# Patient Record
Sex: Female | Born: 1935 | Race: Black or African American | Hispanic: No | Smoking: Former smoker
Health system: Southern US, Community
[De-identification: ages and names within clinical notes are randomized; demographics above are authoritative.]

## PROBLEM LIST (undated history)

## (undated) DIAGNOSIS — I739 Peripheral vascular disease, unspecified: Secondary | ICD-10-CM

---

## 2010-05-30 ENCOUNTER — Emergency Department (HOSPITAL_BASED_OUTPATIENT_CLINIC_OR_DEPARTMENT_OTHER)
Admission: EM | Admit: 2010-05-30 | Discharge: 2010-05-30 | Payer: Self-pay | Source: Home / Self Care | Admitting: Emergency Medicine

## 2010-05-30 ENCOUNTER — Ambulatory Visit: Payer: Self-pay | Admitting: Diagnostic Radiology

## 2011-01-27 ENCOUNTER — Emergency Department (HOSPITAL_BASED_OUTPATIENT_CLINIC_OR_DEPARTMENT_OTHER)
Admission: EM | Admit: 2011-01-27 | Discharge: 2011-01-27 | Disposition: A | Discharge status: Home / Self Care | Payer: PRIVATE HEALTH INSURANCE | Attending: Emergency Medicine | Admitting: Emergency Medicine

## 2011-01-27 DIAGNOSIS — K219 Gastro-esophageal reflux disease without esophagitis: Secondary | ICD-10-CM | POA: Insufficient documentation

## 2011-01-27 DIAGNOSIS — E7800 Pure hypercholesterolemia, unspecified: Secondary | ICD-10-CM | POA: Insufficient documentation

## 2011-01-27 DIAGNOSIS — J4489 Other specified chronic obstructive pulmonary disease: Secondary | ICD-10-CM | POA: Insufficient documentation

## 2011-01-27 DIAGNOSIS — J449 Chronic obstructive pulmonary disease, unspecified: Secondary | ICD-10-CM | POA: Insufficient documentation

## 2011-01-27 DIAGNOSIS — Z79899 Other long term (current) drug therapy: Secondary | ICD-10-CM | POA: Insufficient documentation

## 2011-01-27 DIAGNOSIS — E039 Hypothyroidism, unspecified: Secondary | ICD-10-CM | POA: Insufficient documentation

## 2011-01-27 DIAGNOSIS — S90569A Insect bite (nonvenomous), unspecified ankle, initial encounter: Secondary | ICD-10-CM | POA: Insufficient documentation

## 2011-10-03 IMAGING — CR DG HAND COMPLETE 3+V*L*
3 series · 3 of 3 positions shown · non-contrast
Comparison: None.

CLINICAL DATA: Left hand pain after a fall.

LEFT HAND - COMPLETE 3+ VIEW

[x hand pa left]
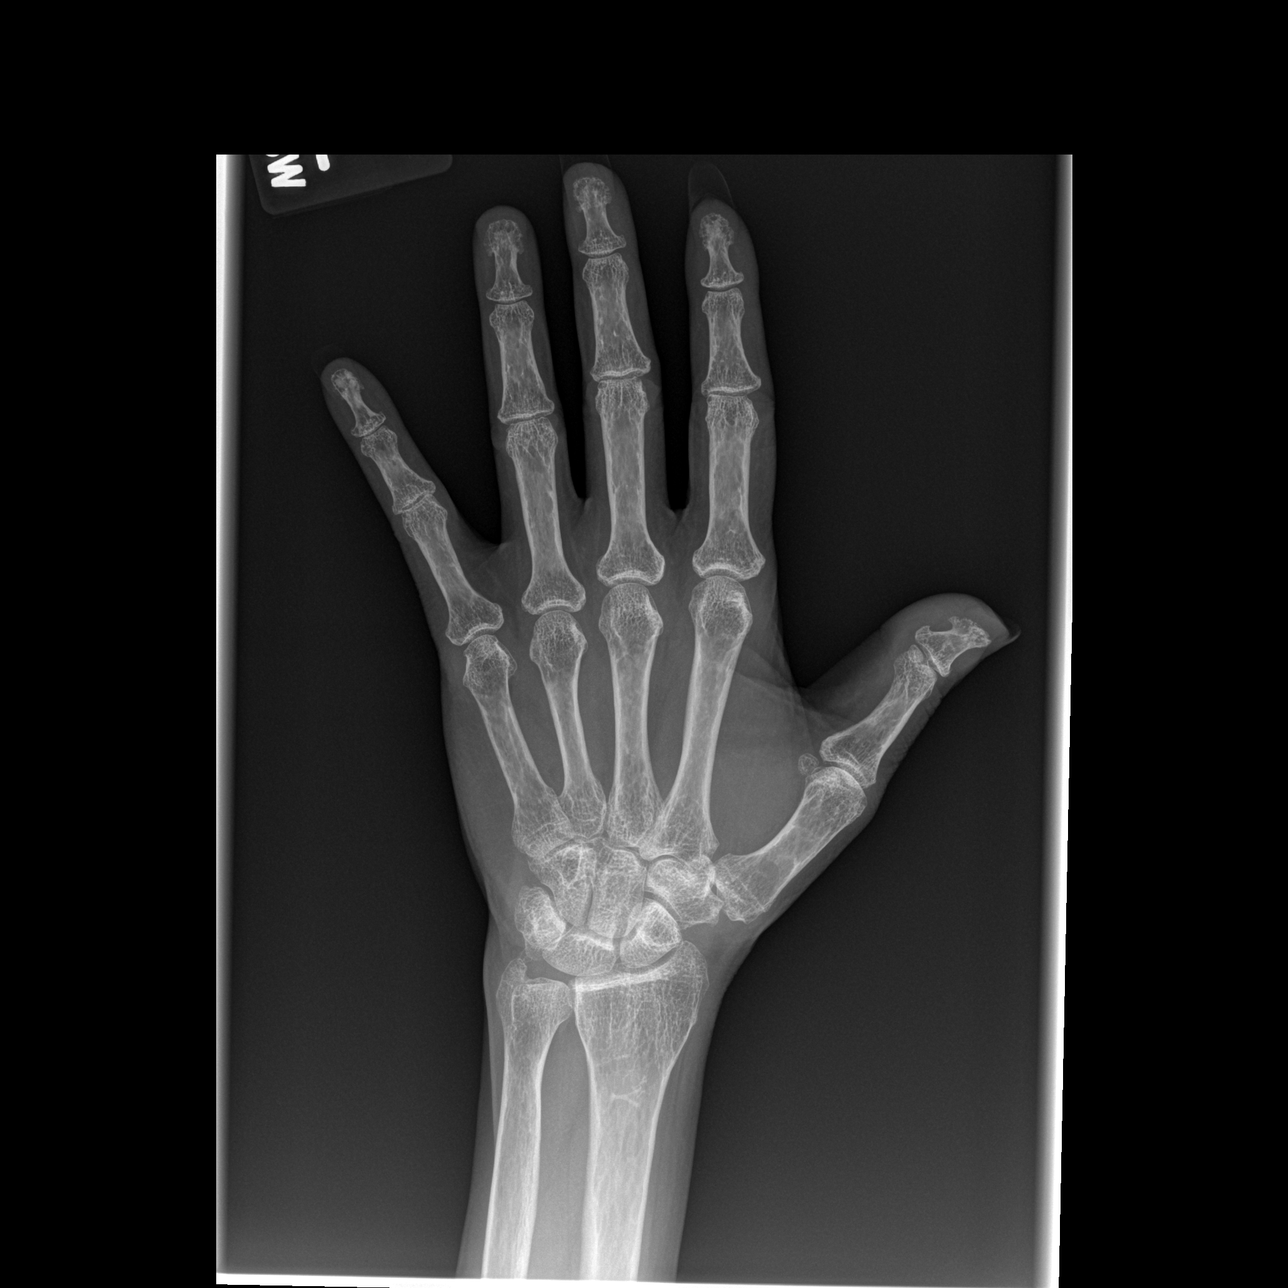

[x hand oblique left]
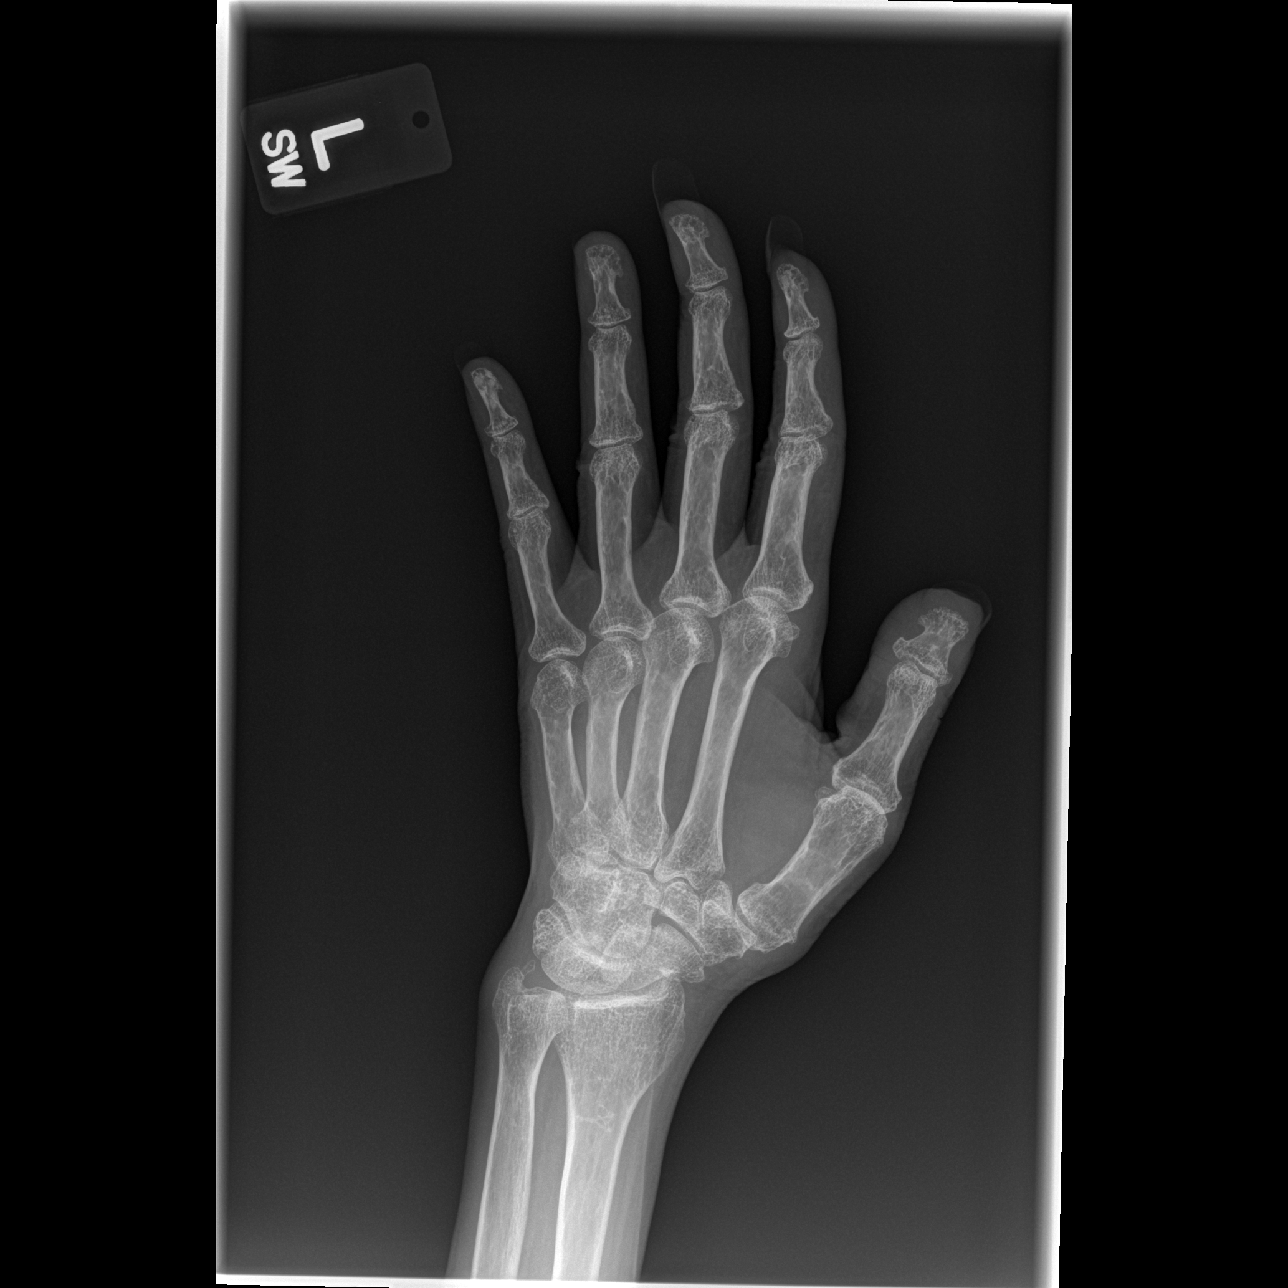

[x hand lat left]
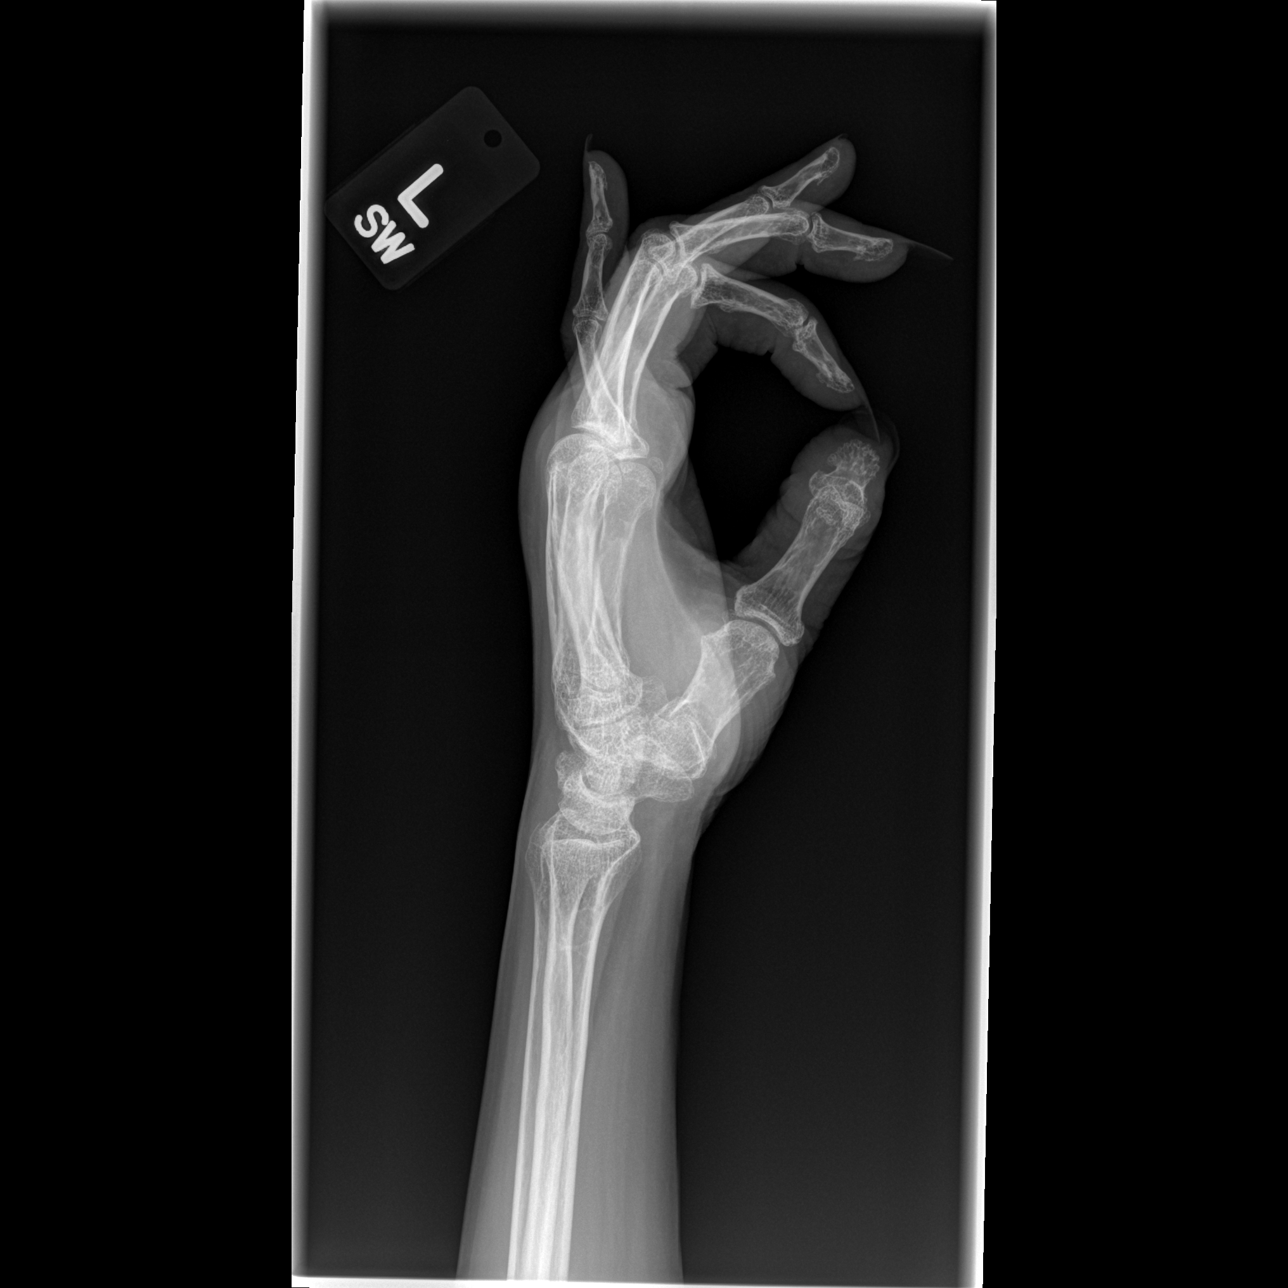

[3 of 3 positions shown; findings below may reference images not displayed]

FINDINGS: Mildly irregular spurring of the distal radius is evident
without visualized acute fracture.  A small well corticated ossific
density adjacent to the ulnar styloid tip and may represent an
unfused ossification center or remote injury.  Mild marrow
heterogeneity is likely due to bony demineralization.  No fracture
identified.
IMPRESSION: 1.  No acute bony findings are identified.

## 2013-02-12 DIAGNOSIS — K219 Gastro-esophageal reflux disease without esophagitis: Secondary | ICD-10-CM | POA: Insufficient documentation

## 2013-02-12 DIAGNOSIS — E039 Hypothyroidism, unspecified: Secondary | ICD-10-CM | POA: Insufficient documentation

## 2013-02-12 DIAGNOSIS — M810 Age-related osteoporosis without current pathological fracture: Secondary | ICD-10-CM | POA: Insufficient documentation

## 2014-02-03 ENCOUNTER — Other Ambulatory Visit: Payer: Self-pay | Admitting: Adult Health

## 2016-02-06 DIAGNOSIS — J9611 Chronic respiratory failure with hypoxia: Secondary | ICD-10-CM | POA: Insufficient documentation

## 2016-03-13 DIAGNOSIS — H04123 Dry eye syndrome of bilateral lacrimal glands: Secondary | ICD-10-CM | POA: Insufficient documentation

## 2016-03-13 DIAGNOSIS — Z961 Presence of intraocular lens: Secondary | ICD-10-CM | POA: Insufficient documentation

## 2016-03-13 DIAGNOSIS — H40013 Open angle with borderline findings, low risk, bilateral: Secondary | ICD-10-CM | POA: Insufficient documentation

## 2016-03-13 DIAGNOSIS — H4323 Crystalline deposits in vitreous body, bilateral: Secondary | ICD-10-CM | POA: Insufficient documentation

## 2016-07-09 DIAGNOSIS — R0902 Hypoxemia: Secondary | ICD-10-CM | POA: Insufficient documentation

## 2016-07-22 DIAGNOSIS — R0989 Other specified symptoms and signs involving the circulatory and respiratory systems: Secondary | ICD-10-CM | POA: Insufficient documentation

## 2016-07-22 DIAGNOSIS — I712 Thoracic aortic aneurysm, without rupture, unspecified: Secondary | ICD-10-CM | POA: Insufficient documentation

## 2016-09-17 DIAGNOSIS — Z9981 Dependence on supplemental oxygen: Secondary | ICD-10-CM | POA: Insufficient documentation

## 2016-09-17 DIAGNOSIS — D649 Anemia, unspecified: Secondary | ICD-10-CM | POA: Insufficient documentation

## 2016-09-17 DIAGNOSIS — Q396 Congenital diverticulum of esophagus: Secondary | ICD-10-CM | POA: Insufficient documentation

## 2016-10-15 DIAGNOSIS — I714 Abdominal aortic aneurysm, without rupture, unspecified: Secondary | ICD-10-CM | POA: Insufficient documentation

## 2016-10-15 DIAGNOSIS — I6523 Occlusion and stenosis of bilateral carotid arteries: Secondary | ICD-10-CM | POA: Insufficient documentation

## 2017-02-05 DIAGNOSIS — L600 Ingrowing nail: Secondary | ICD-10-CM | POA: Insufficient documentation

## 2017-05-21 DIAGNOSIS — I70209 Unspecified atherosclerosis of native arteries of extremities, unspecified extremity: Secondary | ICD-10-CM | POA: Insufficient documentation

## 2017-06-02 DIAGNOSIS — R42 Dizziness and giddiness: Secondary | ICD-10-CM | POA: Insufficient documentation

## 2017-09-04 DIAGNOSIS — Z9889 Other specified postprocedural states: Secondary | ICD-10-CM

## 2017-09-04 DIAGNOSIS — Z8679 Personal history of other diseases of the circulatory system: Secondary | ICD-10-CM | POA: Insufficient documentation

## 2017-09-15 ENCOUNTER — Ambulatory Visit: Payer: Self-pay | Admitting: Podiatry

## 2017-09-15 ENCOUNTER — Ambulatory Visit: Payer: Medicare Other | Admitting: Podiatry

## 2017-09-15 DIAGNOSIS — B351 Tinea unguium: Secondary | ICD-10-CM | POA: Diagnosis not present

## 2017-09-15 DIAGNOSIS — M79674 Pain in right toe(s): Secondary | ICD-10-CM

## 2017-09-15 DIAGNOSIS — L600 Ingrowing nail: Secondary | ICD-10-CM | POA: Diagnosis not present

## 2017-09-15 NOTE — Patient Instructions (Signed)

## 2017-09-16 ENCOUNTER — Telehealth: Payer: Self-pay | Admitting: *Deleted

## 2017-09-16 DIAGNOSIS — R0989 Other specified symptoms and signs involving the circulatory and respiratory systems: Secondary | ICD-10-CM

## 2017-09-16 NOTE — Progress Notes (Signed)
Subjective:   Patient ID: Christine Flores, female   DOB: 82 y.o.   MRN: [id number]   HPI [id number] year old female presents the office with concerns of pain to the right big toenail which is been ongoing for the last couple of months.  She states that she previously had an ingrown toenail and had the corners removed and did well but it came back and started become painful again.  She denies any redness or drainage or any swelling.  She has no other concerns today.  Review of Systems  All other systems reviewed and are negative.  No past medical history on file.  No current outpatient medications on file.  Allergies not on file  Social History   Socioeconomic History  . Marital status: Widowed    Spouse name: Not on file  . Number of children: Not on file  . Years of education: Not on file  . Highest education level: Not on file  Social Needs  . Financial resource strain: Not on file  . Food insecurity - worry: Not on file  . Food insecurity - inability: Not on file  . Transportation needs - medical: Not on file  . Transportation needs - non-medical: Not on file  Occupational History  . Not on file  Tobacco Use  . Smoking status: Not on file  Substance and Sexual Activity  . Alcohol use: Not on file  . Drug use: Not on file  . Sexual activity: Not on file  Other Topics Concern  . Not on file  Social History Narrative  . Not on file        Objective:  Physical Exam  General: AAO x3, NAD  Dermatological: The right hallux toenail is hypertrophic, dystrophic, discolored with yellow-brown discoloration is incurvation along both the medial lateral aspects mostly on the medial aspect.  Upon debridement there is no drainage or pus expressed there is no clinical signs of infection.  After debridement there is resolution of symptoms.  No open lesions or pre-ulcerative lesions identified today.  Vascular: DP, PT pulse decreased on the right side compared to the contralateral extremity.   CRT less than 3 seconds.  Decreased pedal hair.  Some mild swelling to the right ankle but there is no pain to the area.  Neruologic: Grossly intact via light touch bilateral. Protective threshold with Semmes Wienstein monofilament intact to all pedal sites bilateral.   Musculoskeletal: No gross boney pedal deformities bilateral. No pain, crepitus, or limitation noted with foot and ankle range of motion bilateral. Muscular strength 5/5 in all groups tested bilateral.      Assessment:   Right hallux ingrown toenail, onychomycosis; PAD    Plan:  -Treatment options discussed including all alternatives, risks, and complications -Etiology of symptoms were discussed -I did an ABI in the office which was normal and the left foot did reveal "PAD" on the right side.  Because of this I ordered arterial duplex and vascular referral was ordered.  [facility].  -Due to the abnormal study I did not want to proceed with a partial nail avulsion.  I was able to sharply debride the right hallux toenail today without any complications or bleeding to remove the symptomatic portion of ingrown toenail as well as some callus/dry skin along the medial aspect of the nail.  If symptoms continue to consider further partial nail avulsion after arterial evaluation.  She understands this and agrees this plan.  Vivi BarrackMatthew R Kaston Faughn DPM

## 2017-09-16 NOTE — Telephone Encounter (Signed)
Left message informing Christine Flores - [facility] I was referring pt to [facility] and putting in the orders and referral if she would call with the appt dates I would contact pt. [facility]ax[facility], [facility] clinical[facility]

## 2017-09-17 NOTE — Telephone Encounter (Signed)
[facility]ale[facility] - [facility] states she has scheduled pt for arterial dopplers 09/22/2017 at 2:30pm and a consultation with Dr.Arida 09/29/2017 at 10:40am, and she has informed the pt.

## 2017-09-21 ENCOUNTER — Other Ambulatory Visit: Payer: Self-pay | Admitting: Podiatry

## 2017-09-21 DIAGNOSIS — R0989 Other specified symptoms and signs involving the circulatory and respiratory systems: Secondary | ICD-10-CM

## 2017-09-22 ENCOUNTER — Ambulatory Visit (HOSPITAL_COMMUNITY)
Admission: RE | Admit: 2017-09-22 | Payer: Medicare Other | Source: Ambulatory Visit | Attending: Podiatry | Admitting: Podiatry

## 2017-09-29 ENCOUNTER — Ambulatory Visit: Payer: Medicare Other | Admitting: Cardiovascular Disease

## 2017-09-30 ENCOUNTER — Ambulatory Visit (HOSPITAL_COMMUNITY)
Admission: RE | Admit: 2017-09-30 | Discharge: 2017-09-30 | Disposition: A | Discharge status: Home / Self Care | Payer: Medicare Other | Source: Ambulatory Visit | Attending: Cardiology | Admitting: Cardiology

## 2017-09-30 DIAGNOSIS — I739 Peripheral vascular disease, unspecified: Secondary | ICD-10-CM | POA: Diagnosis not present

## 2017-09-30 DIAGNOSIS — R0989 Other specified symptoms and signs involving the circulatory and respiratory systems: Secondary | ICD-10-CM | POA: Diagnosis present

## 2017-10-02 ENCOUNTER — Telehealth: Payer: Self-pay | Admitting: *Deleted

## 2017-10-02 NOTE — Telephone Encounter (Signed)
Left message requesting pt's dtr, Rico JunkerRegina Boyd call for results of pt's circulation test and orders.

## 2017-10-02 NOTE — Telephone Encounter (Signed)
-----   Message from Vivi BarrackMatthew R Wagoner, DPM sent at 10/01/2017  5:18 PM EST ----- Has an appointment with Dr. Allyson SabalBerry scheduled. Please let her know that her circulation in decrease and should keep appointment with Dr. Allyson SabalBerry. Thanks.

## 2017-10-02 NOTE — Telephone Encounter (Signed)
I spoke with pt's other dtr, Tawanna SoloShawna Bille and informed of Dr. Gabriel RungWagoner's review of results and orders to make certain pt saw Dr. Allyson SabalBerry 10/07/2017. Artist PaisShawna states understanding.

## 2017-10-05 NOTE — Telephone Encounter (Signed)
I informed Rene KocherRegina, pt's circulation had decrease in feet to keep appt with Dr. Allyson SabalBerry. Rene KocherRegina states she will have pt at the appt.

## 2017-10-05 NOTE — Telephone Encounter (Signed)
Pt's dtr, Rico Junkeregina Boyd called for results of circulation test.

## 2017-10-06 ENCOUNTER — Ambulatory Visit (HOSPITAL_COMMUNITY)
Admission: RE | Admit: 2017-10-06 | Discharge: 2017-10-06 | Disposition: A | Discharge status: Home / Self Care | Payer: Medicare Other | Source: Ambulatory Visit | Attending: Internal Medicine | Admitting: Internal Medicine

## 2017-10-06 DIAGNOSIS — I70291 Other atherosclerosis of native arteries of extremities, right leg: Secondary | ICD-10-CM | POA: Diagnosis not present

## 2017-10-06 DIAGNOSIS — R0989 Other specified symptoms and signs involving the circulatory and respiratory systems: Secondary | ICD-10-CM | POA: Diagnosis present

## 2017-10-06 DIAGNOSIS — I708 Atherosclerosis of other arteries: Secondary | ICD-10-CM | POA: Diagnosis not present

## 2017-10-06 DIAGNOSIS — I70202 Unspecified atherosclerosis of native arteries of extremities, left leg: Secondary | ICD-10-CM | POA: Insufficient documentation

## 2017-10-07 ENCOUNTER — Encounter: Payer: Self-pay | Admitting: Cardiovascular Disease

## 2017-10-07 ENCOUNTER — Ambulatory Visit (INDEPENDENT_AMBULATORY_CARE_PROVIDER_SITE_OTHER): Payer: Medicare Other | Admitting: Cardiovascular Disease

## 2017-10-07 DIAGNOSIS — I739 Peripheral vascular disease, unspecified: Secondary | ICD-10-CM

## 2017-10-07 DIAGNOSIS — J449 Chronic obstructive pulmonary disease, unspecified: Secondary | ICD-10-CM | POA: Insufficient documentation

## 2017-10-07 DIAGNOSIS — E7800 Pure hypercholesterolemia, unspecified: Secondary | ICD-10-CM

## 2017-10-07 DIAGNOSIS — E785 Hyperlipidemia, unspecified: Secondary | ICD-10-CM | POA: Insufficient documentation

## 2017-10-07 NOTE — H&P (View-Only) (Signed)
   10/07/2017 Christine Flores   09/10/1935  [id number]  Primary Physician Gassemi, Mike, MD Primary Cardiologist: Shelba Susi J Jacorion Klem MD FACP, FACC, FAHA, FSCAI  HPI:  Chelby Wixon is a 82 y.o. thin and frail appearing widowed African-American female mother of 5, grandmother of a grandchildren is accompanied by her daughter Regina today. She was referred by Dr. Wagoner for peripheral vascular evaluation because of right leg; dictation. She does have a long history of COPD which is lifestyle limiting on continuous home oxygen. She stopped smoking 10 years ago. 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 She does have a small infrarenal abdominal aortic aneurysm measuring 3.7 cm. She complains of lifestyle limiting right lower extreme claudication with Dopplers to show an occluded right common iliac artery with moderate proximal left common iliac artery stenosis.   Current Meds  Medication Sig  . albuterol (PROVENTIL) (5 MG/ML) 0.5% nebulizer solution Take 2.5 mg by nebulization every 4 (four) hours as needed (WHEEZING).  . amitriptyline (ELAVIL) 50 MG tablet Take by mouth.  . atorvastatin (LIPITOR) 10 MG tablet Take by mouth.  . esomeprazole (NEXIUM) 40 MG capsule TAKE 1 CAPSULE(40 MG) BY MOUTH EVERY MORNING BEFORE BREAKFAST  . fluticasone-salmeterol (ADVAIR HFA) 115-21 MCG/ACT inhaler INHALE 2 PUFFS INTO THE LUNGS TWICE DAILY  . levothyroxine (SYNTHROID, LEVOTHROID) 75 MCG tablet Take by mouth.  . meclizine (ANTIVERT) 12.5 MG tablet Take 1 tablet by mouth every day  . tiotropium (SPIRIVA) 18 MCG inhalation capsule Place into inhaler and inhale.     Not on File  Social History   Socioeconomic History  . Marital status: Widowed    Spouse name: Not on file  . Number of children: Not on file  . Years of education: Not on file  . Highest education level: Not on file  Social Needs  .  Financial resource strain: Not on file  . Food insecurity - worry: Not on file  . Food insecurity - inability: Not on file  . Transportation needs - medical: Not on file  . Transportation needs - non-medical: Not on file  Occupational History  . Not on file  Tobacco Use  . Smoking status: Former Smoker  . Smokeless tobacco: Never Used  Substance and Sexual Activity  . Alcohol use: Not on file  . Drug use: Not on file  . Sexual activity: Not on file  Other Topics Concern  . Not on file  Social History Narrative  . Not on file     Review of Systems: General: negative for chills, fever, night sweats or weight changes.  Cardiovascular: negative for chest pain, dyspnea on exertion, edema, orthopnea, palpitations, paroxysmal nocturnal dyspnea or shortness of breath Dermatological: negative for rash Respiratory: negative for cough or wheezing Urologic: negative for hematuria Abdominal: negative for nausea, vomiting, diarrhea, bright red blood per rectum, melena, or hematemesis Neurologic: negative for visual changes, syncope, or dizziness All other systems reviewed and are otherwise negative except as noted above.    Blood pressure 140/78, pulse (!) 103, weight 109 lb (49.4 kg), SpO2 93 %.  General appearance: alert and no distress Neck: no adenopathy, no carotid bruit, no JVD, supple, symmetrical, trachea midline and thyroid not enlarged, symmetric, no tenderness/mass/nodules Lungs: clear to auscultation bilaterally Heart: regular rate and rhythm, S1, S2 normal, no murmur, click, rub or gallop Extremities: extremities normal, atraumatic, no cyanosis or edema Pulses: diminished bilaterally Skin:   Skin color, texture, turgor normal. No rashes or lesions Neurologic: Alert and oriented X 3, normal strength and tone. Normal symmetric reflexes. Normal coordination and gait  EKG not performed today  ASSESSMENT AND PLAN:   Peripheral arterial disease (HCC) Ms. Amer is referred to me  by Dr. Wagoner for evaluation of symptomatic right lower; claudication.she apparently had a T-bar procedure performed at [facility] for what sounds like a descending thoracic aortic pseudoaneurysm status post TEVR at [facility] by Dr. Latta. She also has a 3.8 cm infrarenal abdominal aortic aneurysm. She has complained of lifestyle limiting right leg; occasion and had recent Dopplers that showed a right ABI 0.54 with what appears to be an occluded right iliac artery. I'm going to arrange for her to have angiography and potential percutaneous intervention.      Nance Mccombs J. Lawanda Holzheimer MD FACP,FACC,FAHA, FSCAI 10/07/2017 5:00 PM 

## 2017-10-07 NOTE — Patient Instructions (Signed)
   [facility] [address] [place] [place] [address] Dept: [phone] Loc: [phone]  Christine CordialMary Flores  10/07/2017  You are scheduled for a Peripheral Angiogram on Thursday, February 7 with Dr. Nanetta BattyJonathan Flores.  1. [facility] at [facility]: [address] [place], [place] [address] at 9:30 AM (two hours before your procedure to ensure your preparation). Free valet parking service is available.   Special note: Every effort is made to have your procedure done on time. Please understand that emergencies sometimes delay scheduled procedures.  2. Diet: NOTHING TO EAT OR DRINK AFTER MIDNIGHT  3. Labs: Your physician recommends that you return for lab work BY Wednesday NEXT WEEK  4. Medication instructions in preparation for your procedure:  On the morning of your procedure, take your Aspirin and any morning medicines.  You may use sips of water.  5. Plan for one night stay--bring personal belongings. 6. Bring a current list of your medications and current insurance cards. 7. You MUST have a responsible person to drive you home. 8. Someone MUST be with you the first 24 hours after you arrive home or your discharge will be delayed. 9. Please wear clothes that are easy to get on and off and wear slip-on shoes.  Thank you for allowing us to care for you!   -- [facility]

## 2017-10-07 NOTE — Assessment & Plan Note (Signed)
Ms. Christine Flores is referred to me by Dr. Ardelle AntonWagoner for evaluation of symptomatic right lower; claudication.she apparently had a [facility] for what sounds like a descending thoracic aortic pseudoaneurysm [facility] by Dr. Kizzie IdeLatta. She also has a 3.8 cm infrarenal abdominal aortic aneurysm. She has complained of lifestyle limiting right leg; occasion and had recent Dopplers that showed a right ABI 0.54 with what appears to be an occluded right iliac artery. I'm going to arrange for her to have angiography and potential percutaneous intervention.

## 2017-10-07 NOTE — Progress Notes (Signed)
10/07/2017 [name]   Nov 15, 1935  [id number]  Primary Physician Patria Mane, MD Primary Cardiologist: Runell Gess MD Nicholes Cal[place], [place]  HPI:  Issabelle Flores is a 82 y.o. thin and frail appearing widowed African-American female mother of 5, grandmother of a grandchildren is accompanied by her daughter [name] today. She was referred by Dr. Ardelle Anton for peripheral vascular evaluation because of right leg; dictation. She does have a long history of COPD which is lifestyle limiting on continuous home oxygen. She stopped smoking 10 years ago. 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 She does have a small infrarenal abdominal aortic aneurysm measuring 3.7 cm. She complains of lifestyle limiting right lower extreme claudication with Dopplers to show an occluded right common iliac artery with moderate proximal left common iliac artery stenosis.   Current Meds  Medication Sig  . albuterol (PROVENTIL) (5 MG/ML) 0.5% nebulizer solution Take 2.5 mg by nebulization every 4 (four) hours as needed (WHEEZING).  Marland Kitchen amitriptyline (ELAVIL) 50 MG tablet Take by mouth.  Marland Kitchen atorvastatin (LIPITOR) 10 MG tablet Take by mouth.  . esomeprazole (NEXIUM) 40 MG capsule TAKE 1 CAPSULE(40 MG) BY MOUTH EVERY MORNING BEFORE BREAKFAST  . fluticasone-salmeterol (ADVAIR HFA) 115-21 MCG/ACT inhaler INHALE 2 PUFFS INTO THE LUNGS TWICE DAILY  . levothyroxine (SYNTHROID, LEVOTHROID) 75 MCG tablet Take by mouth.  . meclizine (ANTIVERT) 12.5 MG tablet Take 1 tablet by mouth every day  . tiotropium (SPIRIVA) 18 MCG inhalation capsule Place into inhaler and inhale.     Not on File  Social History   Socioeconomic History  . Marital status: Widowed    Spouse name: Not on file  . Number of children: Not on file  . Years of education: Not on file  . Highest education level: Not on file  Social Needs  .  Financial resource strain: Not on file  . Food insecurity - worry: Not on file  . Food insecurity - inability: Not on file  . Transportation needs - medical: Not on file  . Transportation needs - non-medical: Not on file  Occupational History  . Not on file  Tobacco Use  . Smoking status: Former Games developer  . Smokeless tobacco: Never Used  Substance and Sexual Activity  . Alcohol use: Not on file  . Drug use: Not on file  . Sexual activity: Not on file  Other Topics Concern  . Not on file  Social History Narrative  . Not on file     Review of Systems: General: negative for chills, fever, night sweats or weight changes.  Cardiovascular: negative for chest pain, dyspnea on exertion, edema, orthopnea, palpitations, paroxysmal nocturnal dyspnea or shortness of breath Dermatological: negative for rash Respiratory: negative for cough or wheezing Urologic: negative for hematuria Abdominal: negative for nausea, vomiting, diarrhea, bright red blood per rectum, melena, or hematemesis Neurologic: negative for visual changes, syncope, or dizziness All other systems reviewed and are otherwise negative except as noted above.    Blood pressure 140/78, pulse (!) 103, weight 109 lb (49.4 kg), SpO2 93 %.  General appearance: alert and no distress Neck: no adenopathy, no carotid bruit, no JVD, supple, symmetrical, trachea midline and thyroid not enlarged, symmetric, no tenderness/mass/nodules Lungs: clear to auscultation bilaterally Heart: regular rate and rhythm, S1, S2 normal, no murmur, click, rub or gallop Extremities: extremities normal, atraumatic, no cyanosis or edema Pulses: diminished bilaterally Skin:  Skin color, texture, turgor normal. No rashes or lesions Neurologic: Alert and oriented X 3, normal strength and tone. Normal symmetric reflexes. Normal coordination and gait  EKG not performed today  ASSESSMENT AND PLAN:   Peripheral arterial disease (HCC) [name] is referred to me  by Dr. Ardelle AntonWagoner for evaluation of symptomatic right lower; claudication.she apparently had [facility] for what sounds like a descending thoracic aortic pseudoaneurysm status [facility] by Dr. Kizzie IdeLatta. She also has a 3.8 cm infrarenal abdominal aortic aneurysm. She has complained of lifestyle limiting right leg; occasion and had recent Dopplers that showed a right ABI 0.54 with what appears to be an occluded right iliac artery. I'm going to arrange for her to have angiography and potential percutaneous intervention.      Runell GessJonathan J. Loyalty Brashier MD FACP,FACC,FAHA, FSCAI 10/07/2017 5:00 PM

## 2017-10-13 LAB — CBC
HEMATOCRIT: 33.6 % — AB (ref 34.0–46.6)
HEMOGLOBIN: 11.2 g/dL (ref 11.1–15.9)
MCH: 28.1 pg (ref 26.6–33.0)
MCHC: 33.3 g/dL (ref 31.5–35.7)
MCV: 84 fL (ref 79–97)
Platelets: 287 10*3/uL (ref 150–379)
RBC: 3.99 x10E6/uL (ref 3.77–5.28)
RDW: 15.9 % — ABNORMAL HIGH (ref 12.3–15.4)
WBC: 5.9 10*3/uL (ref 3.4–10.8)

## 2017-10-13 LAB — BASIC METABOLIC PANEL
BUN/Creatinine Ratio: 14 (ref 12–28)
BUN: 12 mg/dL (ref 8–27)
CALCIUM: 8.9 mg/dL (ref 8.7–10.3)
CHLORIDE: 102 mmol/L (ref 96–106)
CO2: 25 mmol/L (ref 20–29)
Creatinine, Ser: 0.86 mg/dL (ref 0.57–1.00)
GFR calc Af Amer: 74 mL/min/{1.73_m2} (ref >59)
GFR calc non Af Amer: 64 mL/min/{1.73_m2} (ref >59)
Glucose: 102 mg/dL — ABNORMAL HIGH (ref 65–99)
POTASSIUM: 3.9 mmol/L (ref 3.5–5.2)
Sodium: 143 mmol/L (ref 134–144)

## 2017-10-13 LAB — PROTIME-INR
INR: 1 (ref 0.8–1.2)
PROTHROMBIN TIME: 10.5 s (ref 9.1–12.0)

## 2017-10-15 ENCOUNTER — Ambulatory Visit (HOSPITAL_COMMUNITY)
Admission: RE | Disposition: A | Discharge status: Home / Self Care | Payer: Self-pay | Source: Ambulatory Visit | Attending: Cardiovascular Disease

## 2017-10-15 ENCOUNTER — Ambulatory Visit (HOSPITAL_COMMUNITY)
Admission: RE | Admit: 2017-10-15 | Discharge: 2017-10-16 | Disposition: A | Discharge status: Home / Self Care | Payer: Medicare Other | Source: Ambulatory Visit | Attending: Cardiovascular Disease | Admitting: Cardiovascular Disease

## 2017-10-15 ENCOUNTER — Encounter (HOSPITAL_COMMUNITY): Payer: Self-pay | Admitting: General Practice

## 2017-10-15 DIAGNOSIS — E7800 Pure hypercholesterolemia, unspecified: Secondary | ICD-10-CM

## 2017-10-15 DIAGNOSIS — J449 Chronic obstructive pulmonary disease, unspecified: Secondary | ICD-10-CM | POA: Diagnosis present

## 2017-10-15 DIAGNOSIS — E785 Hyperlipidemia, unspecified: Secondary | ICD-10-CM | POA: Diagnosis not present

## 2017-10-15 DIAGNOSIS — Z9981 Dependence on supplemental oxygen: Secondary | ICD-10-CM | POA: Insufficient documentation

## 2017-10-15 DIAGNOSIS — I10 Essential (primary) hypertension: Secondary | ICD-10-CM | POA: Diagnosis present

## 2017-10-15 DIAGNOSIS — I70212 Atherosclerosis of native arteries of extremities with intermittent claudication, left leg: Secondary | ICD-10-CM | POA: Diagnosis not present

## 2017-10-15 DIAGNOSIS — I70213 Atherosclerosis of native arteries of extremities with intermittent claudication, bilateral legs: Secondary | ICD-10-CM | POA: Diagnosis not present

## 2017-10-15 DIAGNOSIS — I714 Abdominal aortic aneurysm, without rupture: Secondary | ICD-10-CM | POA: Diagnosis not present

## 2017-10-15 DIAGNOSIS — Z7982 Long term (current) use of aspirin: Secondary | ICD-10-CM | POA: Insufficient documentation

## 2017-10-15 DIAGNOSIS — Z87891 Personal history of nicotine dependence: Secondary | ICD-10-CM | POA: Diagnosis not present

## 2017-10-15 DIAGNOSIS — I739 Peripheral vascular disease, unspecified: Secondary | ICD-10-CM | POA: Diagnosis present

## 2017-10-15 HISTORY — DX: Peripheral vascular disease, unspecified: I73.9

## 2017-10-15 HISTORY — PX: LOWER EXTREMITY ANGIOGRAPHY: CATH118251

## 2017-10-15 SURGERY — LOWER EXTREMITY ANGIOGRAPHY
Anesthesia: LOCAL

## 2017-10-15 MED ORDER — SODIUM CHLORIDE 0.9 % IV SOLN: 250.0000 mL | INTRAVENOUS | Status: DC | PRN

## 2017-10-15 MED ORDER — ONDANSETRON HCL 4 MG/2ML IJ SOLN
4.0000 mg | Freq: Four times a day (QID) | INTRAMUSCULAR | Status: DC | PRN
  Filled 2017-10-15: qty 2

## 2017-10-15 MED ORDER — TIOTROPIUM BROMIDE MONOHYDRATE 18 MCG IN CAPS
18.0000 ug | ORAL_CAPSULE | Freq: Every day | RESPIRATORY_TRACT | Status: DC
  Filled 2017-10-15: qty 5

## 2017-10-15 MED ORDER — ACETAMINOPHEN ER 650 MG PO TBCR: 1300.0000 mg | EXTENDED_RELEASE_TABLET | Freq: Three times a day (TID) | ORAL | Status: DC | PRN

## 2017-10-15 MED ORDER — AMITRIPTYLINE HCL 50 MG PO TABS
50.0000 mg | ORAL_TABLET | Freq: Every day | ORAL | Status: DC
  Administered 2017-10-15: 20:00:00 50 mg via ORAL
  Filled 2017-10-15: qty 1
  Filled 2017-10-15: qty 2

## 2017-10-15 MED ORDER — ALBUTEROL SULFATE (2.5 MG/3ML) 0.083% IN NEBU: 2.5000 mg | INHALATION_SOLUTION | RESPIRATORY_TRACT | Status: DC | PRN

## 2017-10-15 MED ORDER — MOMETASONE FURO-FORMOTEROL FUM 200-5 MCG/ACT IN AERO
2.0000 | INHALATION_SPRAY | Freq: Two times a day (BID) | RESPIRATORY_TRACT | Status: DC
  Administered 2017-10-15: 21:00:00 2 via RESPIRATORY_TRACT
  Filled 2017-10-15 (×2): qty 8.8

## 2017-10-15 MED ORDER — ASPIRIN EC 81 MG PO TBEC: 81.0000 mg | DELAYED_RELEASE_TABLET | Freq: Every day | ORAL | Status: DC

## 2017-10-15 MED ORDER — ASPIRIN EC 81 MG PO TBEC
81.0000 mg | DELAYED_RELEASE_TABLET | Freq: Every day | ORAL | Status: DC
  Administered 2017-10-16: 08:00:00 81 mg via ORAL
  Filled 2017-10-15: qty 1

## 2017-10-15 MED ORDER — SODIUM CHLORIDE 0.9 % IV SOLN
INTRAVENOUS | Status: AC
  Administered 2017-10-15: 15:00:00 via INTRAVENOUS

## 2017-10-15 MED ORDER — PANTOPRAZOLE SODIUM 40 MG PO TBEC
40.0000 mg | DELAYED_RELEASE_TABLET | Freq: Every day | ORAL | Status: DC
  Administered 2017-10-15 – 2017-10-16 (×2): 40 mg via ORAL
  Filled 2017-10-15 (×3): qty 1

## 2017-10-15 MED ORDER — SODIUM CHLORIDE 0.9 % WEIGHT BASED INFUSION: 1.0000 mL/kg/h | INTRAVENOUS | Status: DC

## 2017-10-15 MED ORDER — SODIUM CHLORIDE 0.9% FLUSH: 3.0000 mL | INTRAVENOUS | Status: DC | PRN

## 2017-10-15 MED ORDER — LIDOCAINE HCL (PF) 1 % IJ SOLN
INTRAMUSCULAR | Status: DC | PRN
  Administered 2017-10-15: 28 mL

## 2017-10-15 MED ORDER — SODIUM CHLORIDE 0.9% FLUSH: 3.0000 mL | Freq: Two times a day (BID) | INTRAVENOUS | Status: DC

## 2017-10-15 MED ORDER — SODIUM CHLORIDE 0.9% FLUSH
3.0000 mL | Freq: Two times a day (BID) | INTRAVENOUS | Status: DC
  Administered 2017-10-16: 08:00:00 3 mL via INTRAVENOUS

## 2017-10-15 MED ORDER — HYDRALAZINE HCL 20 MG/ML IJ SOLN: 5.0000 mg | INTRAMUSCULAR | Status: DC | PRN

## 2017-10-15 MED ORDER — ATORVASTATIN CALCIUM 10 MG PO TABS
10.0000 mg | ORAL_TABLET | Freq: Every day | ORAL | Status: DC
  Administered 2017-10-15 – 2017-10-16 (×2): 10 mg via ORAL
  Filled 2017-10-15 (×2): qty 1

## 2017-10-15 MED ORDER — SODIUM CHLORIDE 0.9 % WEIGHT BASED INFUSION
3.0000 mL/kg/h | INTRAVENOUS | Status: DC
  Administered 2017-10-15: 3 mL/kg/h via INTRAVENOUS

## 2017-10-15 MED ORDER — LIDOCAINE HCL 1 % IJ SOLN
INTRAMUSCULAR | Status: AC
  Filled 2017-10-15: qty 20

## 2017-10-15 MED ORDER — HEPARIN (PORCINE) IN NACL 2-0.9 UNIT/ML-% IJ SOLN
INTRAMUSCULAR | Status: AC
  Filled 2017-10-15: qty 1000

## 2017-10-15 MED ORDER — ASPIRIN 81 MG PO CHEW
CHEWABLE_TABLET | ORAL | Status: AC
  Administered 2017-10-15: 81 mg via ORAL
  Filled 2017-10-15: qty 1

## 2017-10-15 MED ORDER — ALBUTEROL SULFATE HFA 108 (90 BASE) MCG/ACT IN AERS: 2.0000 | INHALATION_SPRAY | Freq: Four times a day (QID) | RESPIRATORY_TRACT | Status: DC | PRN

## 2017-10-15 MED ORDER — ASPIRIN 81 MG PO CHEW
81.0000 mg | CHEWABLE_TABLET | ORAL | Status: AC
  Administered 2017-10-15: 81 mg via ORAL

## 2017-10-15 MED ORDER — IODIXANOL 320 MG/ML IV SOLN
INTRAVENOUS | Status: DC | PRN
  Administered 2017-10-15: 126 mL via INTRA_ARTERIAL

## 2017-10-15 MED ORDER — LEVOTHYROXINE SODIUM 88 MCG PO TABS: 88.0000 ug | ORAL_TABLET | Freq: Every day | ORAL | Status: DC

## 2017-10-15 MED ORDER — HYDRALAZINE HCL 20 MG/ML IJ SOLN
INTRAMUSCULAR | Status: DC | PRN
  Administered 2017-10-15 (×2): 10 mg via INTRAVENOUS

## 2017-10-15 MED ORDER — ACETAMINOPHEN 325 MG PO TABS
650.0000 mg | ORAL_TABLET | ORAL | Status: DC | PRN
  Administered 2017-10-15 – 2017-10-16 (×4): 650 mg via ORAL
  Filled 2017-10-15 (×4): qty 2

## 2017-10-15 MED ORDER — HEPARIN (PORCINE) IN NACL 2-0.9 UNIT/ML-% IJ SOLN
INTRAMUSCULAR | Status: AC | PRN
  Administered 2017-10-15: 1000 mL

## 2017-10-15 MED ORDER — HYDRALAZINE HCL 20 MG/ML IJ SOLN
INTRAMUSCULAR | Status: AC
  Filled 2017-10-15: qty 1

## 2017-10-15 MED ORDER — LABETALOL HCL 5 MG/ML IV SOLN
10.0000 mg | INTRAVENOUS | Status: DC | PRN
  Administered 2017-10-16: 10 mg via INTRAVENOUS
  Filled 2017-10-15: qty 4

## 2017-10-15 SURGICAL SUPPLY — 7 items
CATH ANGIO 5F PIGTAIL 65CM (CATHETERS) ×2 IMPLANT
KIT PV (KITS) ×2 IMPLANT
SHEATH PINNACLE 5F 10CM (SHEATH) ×2 IMPLANT
SYRINGE MEDRAD AVANTA MACH 7 (SYRINGE) ×2 IMPLANT
TRANSDUCER W/STOPCOCK (MISCELLANEOUS) ×2 IMPLANT
TRAY PV CATH (CUSTOM PROCEDURE TRAY) ×2 IMPLANT
WIRE HITORQ VERSACORE ST 145CM (WIRE) ×2 IMPLANT

## 2017-10-15 NOTE — Progress Notes (Signed)
Site area: lt groin fa sheath Site Prior to Removal:  Level 0 Pressure Applied For:  20 minutes Manual:   yes Patient Status During Pull:  stable Post Pull Site:  Level  0 Post Pull Instructions Given:  yes Post Pull Pulses Present: dopplered Dressing Applied:  Gauze and tegaderm Bedrest begins @ 1515 Comments:   

## 2017-10-15 NOTE — Interval H&P Note (Signed)
History and Physical Interval Note:  10/15/2017 1:22 PM  Christine Flores  has presented today for surgery, with the diagnosis of PVD  The various methods of treatment have been discussed with the patient and family. After consideration of risks, benefits and other options for treatment, the patient has consented to  Procedure(s): LOWER EXTREMITY ANGIOGRAPHY (N/A) as a surgical intervention .  The patient's history has been reviewed, patient examined, no change in status, stable for surgery.  I have reviewed the patient's chart and labs.  Questions were answered to the patient's satisfaction.     Nanetta BattyJonathan Berry

## 2017-10-16 ENCOUNTER — Encounter (HOSPITAL_COMMUNITY): Payer: Self-pay | Admitting: Cardiovascular Disease

## 2017-10-16 DIAGNOSIS — J449 Chronic obstructive pulmonary disease, unspecified: Secondary | ICD-10-CM | POA: Diagnosis not present

## 2017-10-16 DIAGNOSIS — I739 Peripheral vascular disease, unspecified: Secondary | ICD-10-CM

## 2017-10-16 DIAGNOSIS — E782 Mixed hyperlipidemia: Secondary | ICD-10-CM

## 2017-10-16 DIAGNOSIS — I70213 Atherosclerosis of native arteries of extremities with intermittent claudication, bilateral legs: Secondary | ICD-10-CM | POA: Diagnosis not present

## 2017-10-16 DIAGNOSIS — I10 Essential (primary) hypertension: Secondary | ICD-10-CM | POA: Diagnosis present

## 2017-10-16 LAB — CBC
HCT: 32.7 % — ABNORMAL LOW (ref 36.0–46.0)
HEMOGLOBIN: 10.5 g/dL — AB (ref 12.0–15.0)
MCH: 28.2 pg (ref 26.0–34.0)
MCHC: 32.1 g/dL (ref 30.0–36.0)
MCV: 87.9 fL (ref 78.0–100.0)
PLATELETS: 244 10*3/uL (ref 150–400)
RBC: 3.72 MIL/uL — AB (ref 3.87–5.11)
RDW: 15.7 % — ABNORMAL HIGH (ref 11.5–15.5)
WBC: 6.1 10*3/uL (ref 4.0–10.5)

## 2017-10-16 LAB — BASIC METABOLIC PANEL
ANION GAP: 14 (ref 5–15)
BUN: 9 mg/dL (ref 6–20)
CALCIUM: 8.7 mg/dL — AB (ref 8.9–10.3)
CO2: 20 mmol/L — AB (ref 22–32)
CREATININE: 0.76 mg/dL (ref 0.44–1.00)
Chloride: 103 mmol/L (ref 101–111)
Glucose, Bld: 81 mg/dL (ref 65–99)
Potassium: 3.6 mmol/L (ref 3.5–5.1)
SODIUM: 137 mmol/L (ref 135–145)

## 2017-10-16 MED ORDER — NEBIVOLOL HCL 2.5 MG PO TABS: 2.5000 mg | ORAL_TABLET | Freq: Every day | ORAL | 11 refills | Status: DC

## 2017-10-16 MED ORDER — LOSARTAN POTASSIUM 25 MG PO TABS: 25.0000 mg | ORAL_TABLET | Freq: Every day | ORAL | 11 refills | Status: DC

## 2017-10-16 MED ORDER — HYDRALAZINE HCL 20 MG/ML IJ SOLN: 10.0000 mg | Freq: Four times a day (QID) | INTRAMUSCULAR | Status: DC | PRN

## 2017-10-16 MED ORDER — ACETAMINOPHEN 325 MG PO TABS: 650.0000 mg | ORAL_TABLET | Freq: Four times a day (QID) | ORAL | Status: DC | PRN

## 2017-10-16 MED ORDER — NEBIVOLOL HCL 2.5 MG PO TABS
2.5000 mg | ORAL_TABLET | Freq: Every day | ORAL | Status: DC
  Administered 2017-10-16: 2.5 mg via ORAL
  Filled 2017-10-16: qty 1

## 2017-10-16 MED ORDER — LOSARTAN POTASSIUM 50 MG PO TABS
25.0000 mg | ORAL_TABLET | Freq: Every day | ORAL | Status: DC
  Administered 2017-10-16: 25 mg via ORAL
  Filled 2017-10-16: qty 1

## 2017-10-16 MED FILL — Lidocaine HCl Local Inj 1%: INTRAMUSCULAR | Qty: 40 | Status: AC

## 2017-10-16 MED FILL — Heparin Sodium (Porcine) 2 Unit/ML in Sodium Chloride 0.9%: INTRAMUSCULAR | Qty: 1000 | Status: AC

## 2017-10-16 NOTE — Care Management Note (Signed)
Case Management Note  Patient Details  Name: Christine Flores MRN: [id number] Date of Birth: 05/09/1936  Subjective/Objective:  From home with daughter, she has home oxygen, she has a w/chair , walker and a cane at home .  She has a PCP and medication coverage.                    Action/Plan: DC home, no needs.   Expected Discharge Date:  10/16/17               Expected Discharge Plan:  Home/Self Care  In-House Referral:     Discharge planning Services  CM Consult  Post Acute Care Choice:    Choice offered to:     DME Arranged:    DME Agency:     HH Arranged:    HH Agency:     Status of Service:  Completed, signed off  If discussed at [employer] Length of Stay Meetings, dates discussed:    Additional Comments:  [name], [name], RN 10/16/2017, 10:31 AM

## 2017-10-16 NOTE — Plan of Care (Signed)
  Progressing Clinical Measurements: Will remain free from infection 10/16/2017 0115 - Progressing by Leata MouseAninon, Jevaun Strick S, RN Cardiovascular complication will be avoided 10/16/2017 0115 - Progressing by Leata MouseAninon, Vasti Yagi S, RN Pain Managment: General experience of comfort will improve 10/16/2017 0115 - Progressing by Leata MouseAninon, Dewayne Severe S, RN Safety: Ability to remain free from injury will improve 10/16/2017 0115 - Progressing by Leata MouseAninon, Deyton Ellenbecker S, RN Cardiovascular: Ability to achieve and maintain adequate cardiovascular perfusion will improve 10/16/2017 0115 - Progressing by Leata MouseAninon, Ediberto Sens S, RN Vascular access site(s) Level 0-1 will be maintained 10/16/2017 0115 - Progressing by Leata MouseAninon, Yuvin Bussiere S, RN

## 2017-10-16 NOTE — Progress Notes (Signed)
Progress Note  Patient Name: Mckenize Mezera Date of Encounter: 10/16/2017  Primary Cardiologist: Dr Bevelyn Buckles  Subjective   C/O headache. No SOB  Inpatient Medications    Scheduled Meds: . amitriptyline  50 mg Oral QHS  . aspirin EC  81 mg Oral Daily  . atorvastatin  10 mg Oral Daily  . levothyroxine  88 mcg Oral QAC breakfast  . mometasone-formoterol  2 puff Inhalation BID  . pantoprazole  40 mg Oral Daily  . sodium chloride flush  3 mL Intravenous Q12H  . tiotropium  18 mcg Inhalation Daily   Continuous Infusions: . sodium chloride     PRN Meds: sodium chloride, acetaminophen, albuterol, hydrALAZINE, labetalol, ondansetron (ZOFRAN) IV, sodium chloride flush   Vital Signs    Vitals:   10/15/17 2300 10/16/17 0000 10/16/17 0400 10/16/17 0402  BP: (!) 147/93 (!) 172/77 (!) 167/78 (!) 167/78  Pulse: (!) 108 (!) 103 (!) 101 100  Resp: 14 14 16 16   Temp:    98.6 F (37 C)  TempSrc:    Oral  SpO2: 100% 100% 99% 100%  Weight:    105 lb 2.6 oz (47.7 kg)  Height:        Intake/Output Summary (Last 24 hours) at 10/16/2017 0805 Last data filed at 10/15/2017 1900 Gross per 24 hour  Intake 300 ml  Output -  Net 300 ml   Filed Weights   10/15/17 0939 10/16/17 0402  Weight: 109 lb (49.4 kg) 105 lb 2.6 oz (47.7 kg)    Telemetry    NSR, ST - Personally Reviewed  ECG     10/07/17- NSR, ST Personally Reviewed  Physical Exam   GEN: No acute distress.  On o2 Neck: No JVD Cardiac: RRR, no murmurs, rubs, or gallops.  Respiratory: decreased breath sounds, no rales GI: Soft, nontender, non-distended  MS: No edema; No deformity. LFA site without hematoma Neuro:  Nonfocal  Psych: Normal affect   Labs    Chemistry Recent Labs  Lab 10/12/17 1515 10/16/17 0513  NA 143 137  K 3.9 3.6  CL 102 103  CO2 25 20*  GLUCOSE 102* 81  BUN 12 9  CREATININE 0.86 0.76  CALCIUM 8.9 8.7*  GFRNONAA 64 >60  GFRAA 74 >60  ANIONGAP  --  14     Hematology Recent Labs  Lab  10/12/17 1515 10/16/17 0513  WBC 5.9 6.1  RBC 3.99 3.72*  HGB 11.2 10.5*  HCT 33.6* 32.7*  MCV 84 87.9  MCH 28.1 28.2  MCHC 33.3 32.1  RDW 15.9* 15.7*  PLT 287 244    Cardiac EnzymesNo results for input(s): TROPONINI in the last 168 hours. No results for input(s): TROPIPOC in the last 168 hours.   BNPNo results for input(s): BNP, PROBNP in the last 168 hours.   DDimer No results for input(s): DDIMER in the last 168 hours.   Radiology    No results found.  Cardiac Studies   PVA 10/15/17-   Patient Profile     82 y.o. female who had a thoracic endograft at [facility] in Oct 2018, COPD on home O2, and HTN, seen by Dr Allyson Sabal for claudication. PVA done 10/15/17 showed RFA occlusion and he feels she will need surgery.   Assessment & Plan    Claudication RFA occlusion, felt to be secondary to endograft procedure  Uncontrolled HTN B/P 200, HR 100  COPD Home O2  Plan: B/P needs to be under control before she is discharged. Add Bystolic low  dose, Hydralazine PRN, daily ARB.   For questions or updates, please contact [facility] Please consult [email or web address] for contact info under Cardiology/STEMI.      Signed, Corine ShelterLuke Kilroy, PA-C  10/16/2017, 8:05 AM    I have examined the patient and reviewed assessment and plan and discussed with patient.  Agree with above as stated.  Needs right CFA endarterectomy. F/u with Dr. Allyson SabalBerry.  Left groin is stable.  No hematoma. GEN: Well nourished, well developed, in no acute distress  HEENT: normal  Neck: no JVD, carotid bruits, or masses Cardiac: RRR; no murmurs, rubs, or gallops,no edema ; no left groin hematoma Respiratory:  clear to auscultation bilaterally, normal work of breathing GI: soft, nontender, nondistended,  MS: no deformity or atrophy  Skin: warm and dry, no rash Neuro:  Strength and sensation are intact Psych: euthymic mood, full affect  Plan for discharge later today.  Lance MussJayadeep Jailyn Langhorst

## 2017-10-16 NOTE — Discharge Summary (Signed)
Patient ID: Olen CordialMary Och,  MRN: [id number], DOB/AGE: 80/11/1935 80 y.o.  Admit date: 10/15/2017 Discharge date: 10/16/2017  Primary Care Provider: Patria ManeGassemi, Mike, MD Primary Cardiologist: Dr Bevelyn BucklesBerry-PV  Discharge Diagnoses Active Problems:   Peripheral arterial disease [facility](HCC)   COPD (chronic obstructive pulmonary disease) (HCC)   Hyperlipidemia   Claudication in peripheral vascular disease [facility](HCC)   Essential hypertension    Procedures: PV angiogram 10/15/17   Hospital Course:  82 y.o. female who [facility] in Oct [facility], COPD on home O2, and HTN, seen by Dr Allyson SabalBerry as an OP for claudication. She was admitted for PVA after OP LEA dopplers suggested significant RLE arterial disease. PVA done 10/15/17 showed RFA occlusion. Dr Allyson SabalBerry feels she will need surgery. He would like to see in the office in two weeks. On the morning of discharge it was noted her B/P and HR were elevated. Losartan and Bystolic (COPD) were added. Her B/P improved and Dr Eldridge DaceVaranasi feels she can be discharged.    Discharge Vitals:  Blood pressure (!) 164/84, pulse 100, temperature 98.7 F (37.1 C), temperature source Oral, resp. rate 14, height 5' 1.5" (1.562 m), weight 105 lb 2.6 oz (47.7 kg), SpO2 100 %.    Labs: Results for orders placed or performed during the hospital encounter of 10/15/17 (from the past 24 hour(s))  Basic metabolic panel      Status: Abnormal   Collection Time: 10/16/17  5:13 AM  Result Value Ref Range   Sodium 137 135 - 145 mmol/L   Potassium 3.6 3.5 - 5.1 mmol/L   Chloride 103 101 - 111 mmol/L   CO2 20 (L) 22 - 32 mmol/L   Glucose, Bld 81 65 - 99 mg/dL   BUN 9 6 - 20 mg/dL   Creatinine, Ser 4.090.76 0.44 - 1.00 mg/dL   Calcium 8.7 (L) 8.9 - 10.3 mg/dL   GFR calc non Af Amer >60 >60 mL/min   GFR calc Af Amer >60 >60 mL/min   Anion gap 14 5 - 15  CBC     Status: Abnormal   Collection Time: 10/16/17  5:13 AM  Result Value Ref Range   WBC 6.1 4.0 - 10.5 K/uL   RBC 3.72 (L)  3.87 - 5.11 MIL/uL   Hemoglobin 10.5 (L) 12.0 - 15.0 g/dL   HCT 81.132.7 (L) 91.436.0 - 78.246.0 %   MCV 87.9 78.0 - 100.0 fL   MCH 28.2 26.0 - 34.0 pg   MCHC 32.1 30.0 - 36.0 g/dL   RDW 95.615.7 (H) 21.311.5 - 08.615.5 %   Platelets 244 150 - 400 K/uL    Disposition:  Follow-up Information    Runell GessBerry, Jonathan J, MD Follow up.   Specialties:  Cardiology, Radiology Why:  office will contact you for follow up Contact information: [address] [place] [address] [phone]           Discharge Medications:  Allergies as of 10/16/2017   No Known Allergies     Medication List    STOP taking these medications   acetaminophen 650 MG CR tablet Commonly known as:  TYLENOL Replaced by:  acetaminophen 325 MG tablet     TAKE these medications   acetaminophen 325 MG tablet Commonly known as:  TYLENOL Take 2 tablets (650 mg total) by mouth every 6 (six) hours as needed for mild pain or headache. Replaces:  acetaminophen 650 MG CR tablet   ADVAIR HFA 115-21 MCG/ACT inhaler Generic drug:  fluticasone-salmeterol INHALE 2 PUFFS INTO THE LUNGS TWICE DAILY   albuterol 108 (90 Base) MCG/ACT inhaler Commonly known as:  PROVENTIL HFA;VENTOLIN HFA Inhale 2 puffs into the lungs every 6 (six) hours as needed for wheezing or shortness of breath.   albuterol (2.5 MG/3ML) 0.083% nebulizer solution Commonly known as:  PROVENTIL Take 2.5 mg by nebulization every 4 (four) hours as needed for wheezing or shortness of breath.   alendronate 70 MG tablet Commonly known as:  FOSAMAX Take 70 mg by mouth every Thursday. Take with a full glass of water on an empty stomach.   amitriptyline 50 MG tablet Commonly known as:  ELAVIL Take 50 mg by mouth at bedtime.   aspirin EC 81 MG tablet Take 81 mg by mouth daily.   atorvastatin 10 MG tablet Commonly known as:  LIPITOR Take 10 mg by mouth daily.   calcium-vitamin D 500-200 MG-UNIT tablet Take 1 tablet by mouth daily.   CENTRUM SILVER PO Take  1 tablet by mouth daily.   dextromethorphan-guaiFENesin 30-600 MG 12hr tablet Commonly known as:  MUCINEX DM Take 1 tablet by mouth 2 (two) times daily as needed for cough.   docusate sodium 100 MG capsule Commonly known as:  COLACE Take 100 mg by mouth daily.   esomeprazole 40 MG capsule Commonly known as:  NEXIUM TAKE 1 CAPSULE(40 MG) BY MOUTH EVERY MORNING BEFORE BREAKFAST   levothyroxine 88 MCG tablet Commonly known as:  SYNTHROID, LEVOTHROID Take 88 mcg by mouth daily before breakfast.   losartan 25 MG tablet Commonly known as:  COZAAR Take 1 tablet (25 mg total) by mouth daily. Start taking on:  10/17/2017   meclizine 12.5 MG tablet Commonly known as:  ANTIVERT Take 12.5 mg by mouth daily   nebivolol 2.5 MG tablet Commonly known as:  BYSTOLIC Take 1 tablet (2.5 mg total) by mouth daily. Start taking on:  10/17/2017   raloxifene 60 MG tablet Commonly known as:  EVISTA Take 60 mg by mouth daily.   tiotropium 18 MCG inhalation capsule Commonly known as:  SPIRIVA Place 18 mcg into inhaler and inhale daily.        Duration of Discharge Encounter: Greater than 30 minutes including physician time.  Signed, Corine Shelter PA-C 10/16/2017 10:29 AM   I have examined the patient and reviewed assessment and plan and discussed with patient.  Agree with above as stated.  Needs right CFA endarterectomy. F/u with Dr. Allyson Sabal.  Left groin is stable.  No hematoma. GEN: Well nourished, well developed, in no acute distress  HEENT: normal  Neck: no JVD, carotid bruits, or masses Cardiac: RRR; no murmurs, rubs, or gallops,no edema ; no left groin hematoma Respiratory:  clear to auscultation bilaterally, normal work of breathing GI: soft, nontender, nondistended,  MS: no deformity or atrophy  Skin: warm and dry, no rash Neuro:  Strength and sensation are intact Psych: euthymic mood, full affect  Plan for discharge later today.  Lance Muss

## 2017-10-28 ENCOUNTER — Ambulatory Visit: Payer: Medicare Other | Admitting: Cardiovascular Disease

## 2017-10-28 ENCOUNTER — Encounter: Payer: Self-pay | Admitting: Cardiovascular Disease

## 2017-10-28 VITALS — BP 158/80 | Ht 61.0 in | Wt 110.4 lb

## 2017-10-28 DIAGNOSIS — I739 Peripheral vascular disease, unspecified: Secondary | ICD-10-CM

## 2017-10-28 NOTE — Patient Instructions (Signed)
Medication Instructions: Your physician recommends that you continue on your current medications as directed. Please refer to the Current Medication list given to you today.  Follow-Up: You have been referred to Dr. Myra GianottiBrabham in Vascular Surgery.  Your physician recommends that you schedule a follow-up appointment in: 3 months with Dr. Allyson SabalBerry.  If you need a refill on your cardiac medications before your next appointment, please call your pharmacy.

## 2017-10-28 NOTE — Progress Notes (Signed)
Ms. Christine Flores returns after her recent PV insulin which I performed on 10/15/17 which demonstrated an occluded right common femoral artery. I suspect this is iatrogenic from her recent [facility] performed at [facility]. She is symptomatic from this and would require endarterectomy and patch angioplasty. I'm referring her to Dr. Myra GianottiBrabham for surgical evaluation.  Runell GessJonathan J. Shantel Helwig, M.D., FACP, [facility] MontgomeryFACC, Earl LagosFAHA, [facility]SC[facility] [address] [place], [place]  [address]  [phone] 10/28/2017 3:25 PM

## 2017-10-28 NOTE — Assessment & Plan Note (Signed)
Ms. Christine Flores returns after her recent PV insulin which I performed on 10/15/17 which demonstrated an occluded right common femoral artery. I suspect this is iatrogenic from her recent [facility]. She is symptomatic from this and would require endarterectomy and patch angioplasty. I'm referring her to Dr. Myra GianottiBrabham for surgical evaluation.

## 2017-11-06 ENCOUNTER — Ambulatory Visit: Payer: Medicare Other | Admitting: Cardiovascular Disease

## 2017-11-09 ENCOUNTER — Telehealth: Payer: Self-pay | Admitting: Surgery

## 2017-11-09 NOTE — Telephone Encounter (Signed)
Moved appt up to 11/23/17 at 11:00. Lm on pt's cell# to inform them of new appt.

## 2017-11-09 NOTE — Telephone Encounter (Signed)
-----   Message from Sharee PimpleMarilyn K McChesney, RN sent at 10/30/2017  9:51 PM EST ----- Regarding: Move up appt per [facility]   ----- Message ----- From: Nada LibmanBrabham, Vance W, MD Sent: 10/30/2017   9:32 PM To: [facility] Charge Pool  Patient scheduled to see me on 3-22.  Dr. Allyson SabalBerry has requested that I see her sooner as she is very symptomatic.  She is a NEW patient.  Thanks

## 2017-11-23 ENCOUNTER — Encounter: Payer: Self-pay | Admitting: Surgery

## 2017-11-23 ENCOUNTER — Other Ambulatory Visit: Payer: Self-pay

## 2017-11-23 ENCOUNTER — Ambulatory Visit (INDEPENDENT_AMBULATORY_CARE_PROVIDER_SITE_OTHER): Payer: Medicare Other | Admitting: Surgery

## 2017-11-23 VITALS — BP 171/81 | HR 83 | Temp 98.6°F | Resp 20 | Ht 61.0 in | Wt 110.0 lb

## 2017-11-23 DIAGNOSIS — I70211 Atherosclerosis of native arteries of extremities with intermittent claudication, right leg: Secondary | ICD-10-CM | POA: Diagnosis not present

## 2017-11-23 NOTE — Progress Notes (Signed)
[facility]  Patient name: Christine Flores MRN: [id number] DOB: 10/04/1935 Sex: female   REQUESTING PROVIDER:    Dr. Allyson SabalBerry   REASON FOR CONSULT:    Claudication  HISTORY OF PRESENT ILLNESS:   Christine CordialMary Flores is a 82 y.o. female, who is referred today for evaluation of right leg claudication.  She was originally evaluated due to a ingrown toenail which could not be addressed because of her circulation.  She ended up undergoing angiography by Dr. Gery PrayBarry which reveals a right femoral artery occlusion.  The patient has [facility] a right femoral approach.  She is complaining of right great toe pain.  She does get pain in her leg with walking.  ABI's were 0.54  She suffers from COPD and is on 3L home O2.  She takes a statin for hypercholesterolemia.  She is medically managed for HTN with an ARB.  She is a former smoker  PAST MEDICAL HISTORY    Past Medical History:  Diagnosis Date  . PVD (peripheral vascular disease) (HCC)      FAMILY HISTORY   No family history on file.  SOCIAL HISTORY:   Social History   Socioeconomic History  . Marital status: Widowed    Spouse name: Not on file  . Number of children: Not on file  . Years of education: Not on file  . Highest education level: Not on file  Social Needs  . Financial resource strain: Not on file  . Food insecurity - worry: Not on file  . Food insecurity - inability: Not on file  . Transportation needs - medical: Not on file  . Transportation needs - non-medical: Not on file  Occupational History  . Not on file  Tobacco Use  . Smoking status: Former Games developermoker  . Smokeless tobacco: Never Used  Substance and Sexual Activity  . Alcohol use: Not on file  . Drug use: Not on file  . Sexual activity: Not on file  Other Topics Concern  . Not on file  Social History Narrative  . Not on file    ALLERGIES:    No Known Allergies  CURRENT  MEDICATIONS:    Current Outpatient Medications  Medication Sig Dispense Refill  . albuterol (PROVENTIL HFA;VENTOLIN HFA) 108 (90 Base) MCG/ACT inhaler Inhale 2 puffs into the lungs every 6 (six) hours as needed for wheezing or shortness of breath.    Marland Kitchen. albuterol (PROVENTIL) (2.5 MG/3ML) 0.083% nebulizer solution Take 2.5 mg by nebulization every 4 (four) hours as needed for wheezing or shortness of breath.    Marland Kitchen. alendronate (FOSAMAX) 70 MG tablet Take 70 mg by mouth every Thursday. Take with a full glass of water on an empty stomach.    Marland Kitchen. amitriptyline (ELAVIL) 50 MG tablet Take 50 mg by mouth at bedtime.     Marland Kitchen. aspirin EC 81 MG tablet Take 81 mg by mouth daily.     Marland Kitchen. atorvastatin (LIPITOR) 10 MG tablet Take 10 mg by mouth daily.     . Calcium Carb-Cholecalciferol (CALCIUM-VITAMIN D) 500-200 MG-UNIT tablet Take 1 tablet by mouth daily.     Marland Kitchen. dextromethorphan-guaiFENesin (MUCINEX DM) 30-600 MG 12hr tablet Take 1 tablet by mouth 2 (two) times daily as needed for cough.    . docusate sodium (COLACE) 100 MG capsule Take 100 mg by mouth daily.     Marland Kitchen. esomeprazole (NEXIUM) 40 MG capsule TAKE 1 CAPSULE(40 MG) BY MOUTH EVERY MORNING BEFORE BREAKFAST    .  fluticasone-salmeterol (ADVAIR HFA) 115-21 MCG/ACT inhaler INHALE 2 PUFFS INTO THE LUNGS TWICE DAILY    . levothyroxine (SYNTHROID, LEVOTHROID) 88 MCG tablet Take 88 mcg by mouth daily before breakfast.     . losartan (COZAAR) 25 MG tablet Take 1 tablet (25 mg total) by mouth daily. 30 tablet 11  . LYCOPENE PO Take by mouth.    . meclizine (ANTIVERT) 12.5 MG tablet Take 12.5 mg by mouth daily    . Multiple Vitamins-Minerals (CENTRUM SILVER PO) Take 1 tablet by mouth daily.    . nebivolol (BYSTOLIC) 2.5 MG tablet Take 1 tablet (2.5 mg total) by mouth daily. 30 tablet 11  . raloxifene (EVISTA) 60 MG tablet Take 60 mg by mouth daily.     Marland Kitchen tiotropium (SPIRIVA) 18 MCG inhalation capsule Place 18 mcg into inhaler and inhale daily.      No current  facility-administered medications for this visit.     REVIEW OF SYSTEMS:   [X]  denotes positive finding, [ ]  denotes negative finding Cardiac  Comments:  Chest pain or chest pressure:    Shortness of breath upon exertion: x   Short of breath when lying flat:    Irregular heart rhythm:        Vascular    Pain in calf, thigh, or hip brought on by ambulation: x   Pain in feet at night that wakes you up from your sleep:     Blood clot in your veins:    Leg swelling:         Pulmonary    Oxygen at home:    Productive cough:     Wheezing:         Neurologic    Sudden weakness in arms or legs:     Sudden numbness in arms or legs:     Sudden onset of difficulty speaking or slurred speech:    Temporary loss of vision in one eye:     Problems with dizziness:         Gastrointestinal    Blood in stool:      Vomited blood:         Genitourinary    Burning when urinating:     Blood in urine:        Psychiatric    Major depression:         Hematologic    Bleeding problems:    Problems with blood clotting too easily:        Skin    Rashes or ulcers:        Constitutional    Fever or chills:     PHYSICAL EXAM:   There were no vitals filed for this visit.  GENERAL: The patient is a well-nourished female, in no acute distress. The vital signs are documented above. CARDIAC: There is a regular rate and rhythm.  VASCULAR: palpable right femoral pulse above the inguinal ligament PULMONARY: Nonlabored respirations ABDOMEN: Soft and non-tender with normal pitched bowel sounds.  MUSCULOSKELETAL: There are no major deformities or cyanosis. NEUROLOGIC: No focal weakness or paresthesias are detected. SKIN: There are no ulcers or rashes noted. PSYCHIATRIC: The patient has a normal affect.  STUDIES:   I Have reviewed her angio showing right femoral opcclusion  ASSESSMENT and PLAN   Right femoral artery occlusion: The patient does describe some right leg pain, however she  does seem to be able to tolerate this.  Her biggest issue has been pain in her right toe.  Her podiatrist does  not want to intervene on this because of her lack of circulation.  I spoke with Dr. Justice Britain via telephone.  He told me the patient had extensive disease in her common femoral artery at the time of her TEVAR.  She underwent endarterectomy with bovine patch angioplasty.  Postoperatively she had a known superficial femoral artery occlusion.  The decision was made to manage her nonoperatively given her comorbidities and lack of symptoms.  She is scheduled to follow-up with them and make.  Because the patient has essentially remained stable with regard to her symptoms I feel the most appropriate for her to continue to receive follow-up at [facility].  The patient and her daughter are okay with that.  We did discuss that if she develops a nonhealing wound or infection or significantly worsening pain that she could contact me.  Otherwise she will follow-up on an as-needed basis.   Durene Cal, MD [facility] [phone] Pager [phone]

## 2017-11-27 ENCOUNTER — Encounter: Payer: Medicare Other | Admitting: Surgery

## 2017-12-11 DIAGNOSIS — R911 Solitary pulmonary nodule: Secondary | ICD-10-CM | POA: Insufficient documentation

## 2017-12-23 ENCOUNTER — Ambulatory Visit: Payer: Medicare Other | Admitting: Podiatry

## 2018-01-01 ENCOUNTER — Ambulatory Visit: Payer: Medicare Other | Admitting: Podiatry

## 2018-01-01 ENCOUNTER — Encounter: Payer: Self-pay | Admitting: Podiatry

## 2018-01-01 DIAGNOSIS — I739 Peripheral vascular disease, unspecified: Secondary | ICD-10-CM | POA: Diagnosis not present

## 2018-01-01 DIAGNOSIS — L600 Ingrowing nail: Secondary | ICD-10-CM

## 2018-01-01 DIAGNOSIS — M79674 Pain in right toe(s): Secondary | ICD-10-CM | POA: Diagnosis not present

## 2018-01-01 DIAGNOSIS — B351 Tinea unguium: Secondary | ICD-10-CM | POA: Diagnosis not present

## 2018-01-04 NOTE — Progress Notes (Signed)
Subjective: 82 y.o. returns the office today for painful, elongated, thickened toenails which she she states that the right big toenail had significant improvement after trim the last time but started to grow back and causing more pain but not as bad as what it was.  Cannot trim herself.  Since I last saw her she is follow-up with Dr. Gery Pray as well as vascular surgery and she is referred back to her vascular surgeon at [facility] for evaluation of her PAD.  She has not yet followed up with them.  Denies any redness or drainage around the nails. Denies any acute changes since last appointment and no new complaints today. Denies any systemic complaints such as fevers, chills, nausea, vomiting.   PCP: Patria Mane, MD Last Seen:  A1c:  Objective: AAO 3, NAD DP/PT pulses decreased Nails hypertrophic, dystrophic, elongated, brittle, discolored 10.  There is incurvation present to both medial lateral aspects of the right hallux toenail today and there is no signs of infection.  There is tenderness overlying the nails 1-5 bilaterally. There is no surrounding erythema or drainage along the nail sites. No open lesions or pre-ulcerative lesions are identified. No other areas of tenderness bilateral lower extremities. No overlying edema, erythema, increased warmth. No pain with calf compression, swelling, warmth, erythema.  Assessment: Patient presents with symptomatic onychomycosis; ingrown toenail ; PAD  Plan: -Treatment options including alternatives, risks, complications were discussed -Nails sharply debrided 10 without complication/bleeding. -Again given her circulation I recommended to hold off on a partial nail avulsion.  Although it is painful to debride it to relieve the pain after debridement today there is resolution of her pain.  Is also no signs of infection.  We will continue to monitor very closely for this.  She and her family member who accompanied her today is in agreement to this.   -Discussed daily foot inspection. If there are any changes, to call the office immediately.  -Follow-up in 3 months or sooner if any problems are to arise. In the meantime, encouraged to call the office with any questions, concerns, changes symptoms.  Ovid Curd, DPM

## 2018-01-19 ENCOUNTER — Ambulatory Visit: Payer: Medicare Other | Admitting: Cardiovascular Disease

## 2018-02-11 DIAGNOSIS — F5104 Psychophysiologic insomnia: Secondary | ICD-10-CM | POA: Insufficient documentation

## 2018-03-05 ENCOUNTER — Ambulatory Visit: Payer: Medicare Other | Admitting: Podiatry

## 2018-04-29 DIAGNOSIS — Z0000 Encounter for general adult medical examination without abnormal findings: Secondary | ICD-10-CM | POA: Insufficient documentation

## 2018-06-23 DIAGNOSIS — R5383 Other fatigue: Secondary | ICD-10-CM | POA: Insufficient documentation

## 2018-08-17 ENCOUNTER — Ambulatory Visit: Payer: Medicare Other | Admitting: Podiatry

## 2018-08-17 DIAGNOSIS — I739 Peripheral vascular disease, unspecified: Secondary | ICD-10-CM | POA: Diagnosis not present

## 2018-08-17 DIAGNOSIS — M79674 Pain in right toe(s): Secondary | ICD-10-CM | POA: Diagnosis not present

## 2018-08-17 DIAGNOSIS — B351 Tinea unguium: Secondary | ICD-10-CM

## 2018-08-17 DIAGNOSIS — M79675 Pain in left toe(s): Secondary | ICD-10-CM

## 2018-08-17 NOTE — Patient Instructions (Signed)

## 2018-09-18 ENCOUNTER — Encounter: Payer: Self-pay | Admitting: Podiatry

## 2018-09-18 NOTE — Progress Notes (Signed)
Subjective: Christine Flores is a 83 y.o. y.o. female who presents for preventative foot care today with PAD and cc of painful, discolored, thick toenails and painful callus/corn which interfere with daily activities. Pain is aggravated when wearing enclosed shoe gear. Pain is relieved with periodic professional debridement.  Patria Mane, MD is her PCP and last dos was 07/30/2018.   Current Outpatient Medications:  .  acetaminophen (TYLENOL) 650 MG CR tablet, Take by mouth., Disp: , Rfl:  .  albuterol (PROVENTIL HFA;VENTOLIN HFA) 108 (90 Base) MCG/ACT inhaler, Inhale 2 puffs into the lungs every 6 (six) hours as needed for wheezing or shortness of breath., Disp: , Rfl:  .  albuterol (PROVENTIL) (2.5 MG/3ML) 0.083% nebulizer solution, Take 2.5 mg by nebulization every 4 (four) hours as needed for wheezing or shortness of breath., Disp: , Rfl:  .  alendronate (FOSAMAX) 70 MG tablet, Take 70 mg by mouth every Thursday. Take with a full glass of water on an empty stomach., Disp: , Rfl:  .  amitriptyline (ELAVIL) 50 MG tablet, Take 50 mg by mouth at bedtime. , Disp: , Rfl:  .  aspirin EC 81 MG tablet, Take 81 mg by mouth daily. , Disp: , Rfl:  .  atorvastatin (LIPITOR) 10 MG tablet, Take 10 mg by mouth daily. , Disp: , Rfl:  .  azithromycin (ZITHROMAX) 250 MG tablet, Take two tablets on the first day and then one tablet every day till gone., Disp: , Rfl:  .  Calcium Carb-Cholecalciferol (CALCIUM-VITAMIN D) 500-200 MG-UNIT tablet, Take 1 tablet by mouth daily. , Disp: , Rfl:  .  calcium-vitamin D (OSCAL WITH D) 500-200 MG-UNIT TABS tablet, Take by mouth., Disp: , Rfl:  .  dextromethorphan-guaiFENesin (MUCINEX DM) 30-600 MG 12hr tablet, Take 1 tablet by mouth 2 (two) times daily as needed for cough., Disp: , Rfl:  .  dimenhyDRINATE (DRAMAMINE) 50 MG tablet, Take by mouth., Disp: , Rfl:  .  docusate sodium (COLACE) 100 MG capsule, Take 100 mg by mouth daily. , Disp: , Rfl:  .  esomeprazole (NEXIUM) 40 MG  capsule, TAKE 1 CAPSULE(40 MG) BY MOUTH EVERY MORNING BEFORE BREAKFAST, Disp: , Rfl:  .  ferrous sulfate 325 (65 FE) MG tablet, Take by mouth., Disp: , Rfl:  .  fluticasone-salmeterol (ADVAIR HFA) 115-21 MCG/ACT inhaler, INHALE 2 PUFFS INTO THE LUNGS TWICE DAILY, Disp: , Rfl:  .  levothyroxine (SYNTHROID, LEVOTHROID) 88 MCG tablet, Take 88 mcg by mouth daily before breakfast. , Disp: , Rfl:  .  losartan (COZAAR) 25 MG tablet, Take 1 tablet (25 mg total) by mouth daily., Disp: 30 tablet, Rfl: 11 .  LYCOPENE PO, Take by mouth., Disp: , Rfl:  .  meclizine (ANTIVERT) 12.5 MG tablet, Take 12.5 mg by mouth daily, Disp: , Rfl:  .  meloxicam (MOBIC) 7.5 MG tablet, TK 1 T PO QD, Disp: , Rfl:  .  Multiple Vitamins-Minerals (CENTRUM SILVER 50+WOMEN PO), Take by mouth., Disp: , Rfl:  .  Multiple Vitamins-Minerals (CENTRUM SILVER 50+WOMEN PO), Take 1 tablet by mouth daily., Disp: , Rfl:  .  Multiple Vitamins-Minerals (CENTRUM SILVER PO), Take 1 tablet by mouth daily., Disp: , Rfl:  .  Multiple Vitamins-Minerals (THERA-M MULTIPLE VITAMINS PO), Take 1 tablet by mouth daily., Disp: , Rfl:  .  nebivolol (BYSTOLIC) 2.5 MG tablet, Take 1 tablet (2.5 mg total) by mouth daily., Disp: 30 tablet, Rfl: 11 .  predniSONE (DELTASONE) 10 MG tablet, , Disp: , Rfl: 0 .  raloxifene (EVISTA)  60 MG tablet, Take 60 mg by mouth daily. , Disp: , Rfl:  .  tiotropium (SPIRIVA) 18 MCG inhalation capsule, Place 18 mcg into inhaler and inhale daily. , Disp: , Rfl:  .  traZODone (DESYREL) 50 MG tablet, , Disp: , Rfl: 3  No Known Allergies  Objective: Vascular Examination: Capillary refill time <3 seconds x 10 digits  Dorsalis pedis pulse palpable left; nonpalpable right foot  Posterior tibial pulses absent nonpalpable b/l  No digital hair x 10 digits  Skin temperature warm to cool b/l  Dermatological Examination: Skin thin, shiny and atrophic b/l  Toenails 1-5 b/l discolored, thick, dystrophic with subungual debris and  pain with palpation to nailbeds due to thickness of nails. Incruvated nailplate to medial and lateral aspects of right great toe. No edema, no erythema, no drainage.  Musculoskeletal: Muscle strength 5/5 to all LE muscle groups  Neurological: Sensation diminished with 10 gram monofilament.  Vibratory sensation diminished   Assessment: 1. Painful onychomycosis toenails 1-5 b/l 2. Ingrown toenail right great toe, noninfected 3. Peripheral arterial disease  Plan: 1. Discuss diabetic foot care principles. Literature dispensed. 2. Toenails 1-5 b/l were debrided in length and girth without iatrogenic bleeding. Offending nail borders right great toe debrided and curretaged. 3. Patient to continue soft, supportive shoe gear 4. Patient to report any pedal injuries to medical professional  5. Follow up 3 months. Patient/POA to call should there be a concern in the interim.

## 2018-10-31 ENCOUNTER — Other Ambulatory Visit (HOSPITAL_COMMUNITY): Payer: Self-pay | Admitting: Cardiology

## 2018-11-16 ENCOUNTER — Ambulatory Visit: Payer: Medicare HMO | Admitting: Podiatry

## 2018-12-05 ENCOUNTER — Other Ambulatory Visit: Payer: Self-pay | Admitting: Cardiology

## 2023-01-07 DEATH — deceased
